# Patient Record
Sex: Female | Born: 1975 | Race: White | Hispanic: No | Marital: Married | Smoking: Former smoker
Health system: Southern US, Community
[De-identification: ages and names within clinical notes are randomized; demographics above are authoritative.]

## PROBLEM LIST (undated history)

## (undated) DIAGNOSIS — M329 Systemic lupus erythematosus, unspecified: Secondary | ICD-10-CM

## (undated) DIAGNOSIS — IMO0002 Reserved for concepts with insufficient information to code with codable children: Secondary | ICD-10-CM

## (undated) HISTORY — PX: APPENDECTOMY: SHX54

## (undated) HISTORY — PX: CHOLECYSTECTOMY: SHX55

## (undated) HISTORY — PX: OTHER SURGICAL HISTORY: SHX169

---

## 2016-01-24 ENCOUNTER — Emergency Department: Payer: BLUE CROSS/BLUE SHIELD

## 2016-01-24 ENCOUNTER — Encounter: Payer: Self-pay | Admitting: Emergency Medicine

## 2016-01-24 ENCOUNTER — Emergency Department
Admission: EM | Admit: 2016-01-24 | Discharge: 2016-01-24 | Disposition: A | Discharge status: Home / Self Care | Payer: BLUE CROSS/BLUE SHIELD | Attending: Student in an Organized Health Care Education/Training Program | Admitting: Student in an Organized Health Care Education/Training Program

## 2016-01-24 DIAGNOSIS — S29019A Strain of muscle and tendon of unspecified wall of thorax, initial encounter: Secondary | ICD-10-CM

## 2016-01-24 DIAGNOSIS — M549 Dorsalgia, unspecified: Secondary | ICD-10-CM | POA: Diagnosis present

## 2016-01-24 DIAGNOSIS — Y92828 Other wilderness area as the place of occurrence of the external cause: Secondary | ICD-10-CM | POA: Diagnosis not present

## 2016-01-24 DIAGNOSIS — Z87891 Personal history of nicotine dependence: Secondary | ICD-10-CM | POA: Diagnosis not present

## 2016-01-24 DIAGNOSIS — Y9311 Activity, swimming: Secondary | ICD-10-CM | POA: Insufficient documentation

## 2016-01-24 DIAGNOSIS — M62830 Muscle spasm of back: Secondary | ICD-10-CM

## 2016-01-24 DIAGNOSIS — Y999 Unspecified external cause status: Secondary | ICD-10-CM | POA: Insufficient documentation

## 2016-01-24 DIAGNOSIS — S29012A Strain of muscle and tendon of back wall of thorax, initial encounter: Secondary | ICD-10-CM | POA: Insufficient documentation

## 2016-01-24 DIAGNOSIS — X501XXA Overexertion from prolonged static or awkward postures, initial encounter: Secondary | ICD-10-CM | POA: Insufficient documentation

## 2016-01-24 MED ORDER — LIDOCAINE HCL (PF) 1 % IJ SOLN
INTRAMUSCULAR | Status: DC
Start: 2016-01-24 — End: 2016-01-25
  Filled 2016-01-24: qty 5

## 2016-01-24 MED ORDER — KETOROLAC TROMETHAMINE 30 MG/ML IJ SOLN
30.0000 mg | Freq: Once | INTRAMUSCULAR | Status: AC
  Administered 2016-01-24: 30 mg via INTRAMUSCULAR
  Filled 2016-01-24: qty 1

## 2016-01-24 MED ORDER — NAPROXEN 500 MG PO TABS: 500.0000 mg | ORAL_TABLET | Freq: Two times a day (BID) | ORAL | 0 refills | Status: AC

## 2016-01-24 MED ORDER — CYCLOBENZAPRINE HCL 10 MG PO TABS: 10.0000 mg | ORAL_TABLET | Freq: Three times a day (TID) | ORAL | 0 refills | Status: DC | PRN

## 2016-01-24 MED ORDER — DIAZEPAM 2 MG PO TABS
2.0000 mg | ORAL_TABLET | Freq: Once | ORAL | Status: AC
Start: 2016-01-24 — End: 2016-01-24
  Administered 2016-01-24: 2 mg via ORAL
  Filled 2016-01-24: qty 1

## 2016-01-24 NOTE — Discharge Instructions (Signed)
Apply warm heat to the affected area x 20 minutes 3-4 times daily.

## 2016-01-24 NOTE — ED Triage Notes (Signed)
Pt ambulatory to triage, no distress noted. Pt c/o right sided back pain and rib pain x3 weeks post fall. Pt states she had her feet taken out from under her, fell forward and has felt pain in the thoracic region of the back and feels worse when taking a deep breath. Pt unable to relieve pain and has not seen MD since injury.

## 2016-01-24 NOTE — ED Provider Notes (Signed)
[facility] ([place]) Emergency Department Provider Note  ____________________________________________  Time seen: Approximately 8:49 PM  I have reviewed the triage vital signs and the nursing notes.   HISTORY  Chief Complaint Back Pain    HPI Madeline Kelley is a 40 y.o. female , NAD, presents to the emergency department with 3 week history of right-sided back pain. [place], grabbed her ankles and pulled them out from underneath her. Patient states she was bent in a U shape with her abdomen and the water trying to stay afloat. States she quickly twisted to try to become upright to get her son to release her ankles and felt a sharp pain in her middle back. Notes that the pain has persisted over the last 3 weeks despite utilizing over-the-counter medications and muscle rubs. States she contacted a physician through a telemedicine app through her insurance to suggested that she be seen for physical evaluation. Patient denies any numbness, weakness, tingling. States she has been unable to lie on the right side due to the pain which has caused a decrease in quality sleep. Has not had any chest pain, shortness breath, neck pain. Has not noted any rashes or skin sores. Has not had any saddle paresthesias, loss of bowel or bladder control.   History reviewed. No pertinent past medical history.  There are no active problems to display for this patient.   Past Surgical History:  Procedure Laterality Date  . CHOLECYSTECTOMY      Prior to Admission medications   Medication Sig Start Date End Date Taking? Authorizing Provider  cyclobenzaprine (FLEXERIL) 10 MG tablet Take 1 tablet (10 mg total) by mouth 3 (three) times daily as needed for muscle spasms. 01/24/16   Brennon Otterness L Keller Bounds, PA-C  naproxen (NAPROSYN) 500 MG tablet Take 1 tablet (500 mg total) by mouth 2 (two) times daily with a meal. 01/24/16   Akiva Josey L Shaneta Cervenka, PA-C     Allergies Review of patient's allergies indicates no known allergies.  History reviewed. No pertinent family history.  Social History Social History  Substance Use Topics  . Smoking status: Former Games developer  . Smokeless tobacco: Never Used  . Alcohol use Yes     Review of Systems  Constitutional: No fever/chills, fatigue Cardiovascular: No chest pain. Respiratory: No shortness of breath. No wheezing.  Gastrointestinal: No abdominal pain.  No nausea, vomiting.  Musculoskeletal: Positive for back pain.  Skin: Negative for rash, redness, swelling, skin sores. Neurological: Negative for headaches, focal weakness or numbness.No saddle paresthesias, loss of bowel or bladder control, tingling 10-point ROS otherwise negative.  ____________________________________________   PHYSICAL EXAM:  VITAL SIGNS: ED Triage Vitals [01/24/16 2000]  Enc Vitals Group     BP (!) 159/90     Pulse Rate (!) 101     Resp 16     Temp 98 F (36.7 C)     Temp Source Oral     SpO2 98 %     Weight 267 lb (121.1 kg)     Height  (1.626 m)     Head Circumference      Peak Flow      Pain Score      Pain Loc      Pain Edu?      Excl. in GC?      Constitutional: Alert and oriented. Well appearing and in no acute distress. Eyes: Conjunctivae are normal without injection or icterus Head: Atraumatic.  Neck: Supple with full range of motion. Hematological/Lymphatic/Immunilogical: No cervical lymphadenopathy. Cardiovascular: Normal rate, regular rhythm. Normal S1 and S2.  Good peripheral circulation. Respiratory: Normal respiratory effort without tachypnea or retractions. Lungs CTAB with breath sounds noted in all lung fields Musculoskeletal: No tenderness to palpation about the midline thoracic, lumbar, sacral spinal area. Tenderness to palpation about the far lateral right lower thoracic region with muscle spasm appreciated. No pain to palpation about the left side of the thoracic  spine. Neurologic:  Normal speech and language. No gross focal neurologic deficits are appreciated.  Skin:  Skin is warm, dry and intact. No rash, redness, bruising, skin sores noted. Psychiatric: Mood and affect are normal. Speech and behavior are normal. Patient exhibits appropriate insight and judgement.   ____________________________________________   LABS  None ____________________________________________  EKG  None ____________________________________________  RADIOLOGY I have personally viewed and evaluated these images (plain radiographs) as part of my medical decision making, as well as reviewing the written report by the radiologist.  Dg Ribs Unilateral W/chest Right  Result Date: 01/24/2016 CLINICAL DATA:  Pt c/o right sided back pain and rib pain x3 weeks post fall. Pt states she had her feet taken out from under her, fell forward and has felt pain in the thoracic region of the back and feels worse when taking a deep breath. EXAM: RIGHT RIBS AND CHEST - 3+ VIEW COMPARISON:  None. FINDINGS: Heart size is normal. Lungs are clear. There are no focal consolidations or pleural effusions. No pneumothorax. No acute displaced fractures. Mild degenerative changes in the thoracic spine. IMPRESSION: No evidence for acute  abnormality. Electronically Signed   By: Norva Pavlov M.D.   On: 01/24/2016 20:41   Dg Thoracic Spine 2 View  Result Date: 01/24/2016 CLINICAL DATA:  Pt c/o right sided back pain and rib pain x3 weeks post fall. Pt states she had her feet taken out from under her, fell forward and has felt pain in the thoracic region of the back and feels worse when taking a deep breath. EXAM: THORACIC SPINE 2 VIEWS COMPARISON:  01/24/2016 FINDINGS: There is mild degenerative change in the mid thoracic spine. No acute fracture or subluxation identified. IMPRESSION: No evidence for acute  abnormality. Electronically Signed   By: Norva Pavlov M.D.   On: 01/24/2016 20:39     ____________________________________________    PROCEDURES  Procedure(s) performed: None   Procedures   Medications  lidocaine (PF) (XYLOCAINE) 1 % injection (not administered)  ketorolac (TORADOL) 30 MG/ML injection 30 mg (30 mg Intramuscular Given 01/24/16 2111)  diazepam (VALIUM) tablet 2 mg (2 mg Oral Given 01/24/16 2111)     ____________________________________________   INITIAL IMPRESSION / ASSESSMENT AND PLAN / ED COURSE  Pertinent labs & imaging results that were available during my care of the patient were reviewed by me and considered in my medical decision making (see chart for details).  Clinical Course    Patient's diagnosis is consistent with Thoracic myofascial strain and muscle spasm of back. Patient will be discharged home with prescriptions for naproxen and Flexeril to take as directed. Patient advised of significant somnolence they can come with taking Flexeril and advised not to drink alcohol or drive while on the medication. Patient is to follow up with [facility] if symptoms persist past this treatment course. Patient is given ED precautions to return to the ED for any worsening or new symptoms.    ____________________________________________  FINAL CLINICAL IMPRESSION(S) / ED DIAGNOSES  Final diagnoses:  Thoracic myofascial strain, initial encounter  Muscle spasm of back      NEW MEDICATIONS STARTED DURING THIS VISIT:  New Prescriptions   CYCLOBENZAPRINE (FLEXERIL) 10 MG TABLET    Take 1 tablet (10 mg total) by mouth 3 (three) times daily as needed for muscle spasms.   NAPROXEN (NAPROSYN) 500 MG TABLET    Take 1 tablet (500 mg total) by mouth 2 (two) times daily with a meal.         Hope PigeonJami L Tiffaney Heimann, PA-C 01/24/16 2133    Willy EddyPatrick Robinson, MD 01/25/16 [phone]

## 2016-02-20 ENCOUNTER — Encounter: Payer: Self-pay | Admitting: *Deleted

## 2016-02-20 ENCOUNTER — Ambulatory Visit
Admission: EM | Admit: 2016-02-20 | Discharge: 2016-02-20 | Disposition: A | Discharge status: Home / Self Care | Payer: BLUE CROSS/BLUE SHIELD | Attending: Family Medicine | Admitting: Family Medicine

## 2016-02-20 DIAGNOSIS — M546 Pain in thoracic spine: Secondary | ICD-10-CM | POA: Diagnosis not present

## 2016-02-20 HISTORY — DX: Systemic lupus erythematosus, unspecified: M32.9

## 2016-02-20 HISTORY — DX: Reserved for concepts with insufficient information to code with codable children: IMO0002

## 2016-02-20 MED ORDER — HYDROCODONE-ACETAMINOPHEN 5-325 MG PO TABS: ORAL_TABLET | ORAL | 0 refills | Status: DC

## 2016-02-20 MED ORDER — PREDNISONE 20 MG PO TABS: 20.0000 mg | ORAL_TABLET | Freq: Every day | ORAL | 0 refills | Status: AC

## 2016-02-20 NOTE — ED Triage Notes (Signed)
Back injury while swimming in June. Seen at [facility] ED approx 1 month after initial injury. Pain is worse now. Pain to right of spine and radiates to right lateral rib region. States has difficulty breathing at times with pain.

## 2016-03-13 ENCOUNTER — Emergency Department: Payer: BLUE CROSS/BLUE SHIELD

## 2016-03-13 ENCOUNTER — Encounter: Payer: Self-pay | Admitting: Emergency Medicine

## 2016-03-13 ENCOUNTER — Observation Stay
Admission: EM | Admit: 2016-03-13 | Discharge: 2016-03-13 | Disposition: A | Discharge status: Home / Self Care | Payer: BLUE CROSS/BLUE SHIELD | Attending: Family Medicine | Admitting: Family Medicine

## 2016-03-13 DIAGNOSIS — R918 Other nonspecific abnormal finding of lung field: Secondary | ICD-10-CM | POA: Diagnosis not present

## 2016-03-13 DIAGNOSIS — R000 Tachycardia, unspecified: Secondary | ICD-10-CM | POA: Insufficient documentation

## 2016-03-13 DIAGNOSIS — R200 Anesthesia of skin: Secondary | ICD-10-CM | POA: Diagnosis not present

## 2016-03-13 DIAGNOSIS — R1011 Right upper quadrant pain: Principal | ICD-10-CM | POA: Insufficient documentation

## 2016-03-13 DIAGNOSIS — R109 Unspecified abdominal pain: Secondary | ICD-10-CM | POA: Diagnosis present

## 2016-03-13 DIAGNOSIS — R079 Chest pain, unspecified: Secondary | ICD-10-CM | POA: Diagnosis not present

## 2016-03-13 DIAGNOSIS — M329 Systemic lupus erythematosus, unspecified: Secondary | ICD-10-CM | POA: Diagnosis not present

## 2016-03-13 DIAGNOSIS — Z87891 Personal history of nicotine dependence: Secondary | ICD-10-CM | POA: Insufficient documentation

## 2016-03-13 DIAGNOSIS — M546 Pain in thoracic spine: Secondary | ICD-10-CM | POA: Insufficient documentation

## 2016-03-13 LAB — COMPREHENSIVE METABOLIC PANEL
ALT: 20 U/L (ref 14–54)
AST: 26 U/L (ref 15–41)
Albumin: 3.8 g/dL (ref 3.5–5.0)
Alkaline Phosphatase: 64 U/L (ref 38–126)
Anion gap: 12 (ref 5–15)
BUN: 17 mg/dL (ref 6–20)
CHLORIDE: 105 mmol/L (ref 101–111)
CO2: 20 mmol/L — ABNORMAL LOW (ref 22–32)
Calcium: 9 mg/dL (ref 8.9–10.3)
Creatinine, Ser: 0.91 mg/dL (ref 0.44–1.00)
Glucose, Bld: 135 mg/dL — ABNORMAL HIGH (ref 65–99)
POTASSIUM: 3.7 mmol/L (ref 3.5–5.1)
Sodium: 137 mmol/L (ref 135–145)
Total Bilirubin: 0.7 mg/dL (ref 0.3–1.2)
Total Protein: 7.7 g/dL (ref 6.5–8.1)

## 2016-03-13 LAB — CBC
HCT: 38.7 % (ref 35.0–47.0)
HEMOGLOBIN: 13.3 g/dL (ref 12.0–16.0)
MCH: 26.7 pg (ref 26.0–34.0)
MCHC: 34.4 g/dL (ref 32.0–36.0)
MCV: 77.4 fL — AB (ref 80.0–100.0)
Platelets: 231 10*3/uL (ref 150–440)
RBC: 4.99 MIL/uL (ref 3.80–5.20)
RDW: 14.6 % — AB (ref 11.5–14.5)
WBC: 7.9 10*3/uL (ref 3.6–11.0)

## 2016-03-13 LAB — URINALYSIS COMPLETE WITH MICROSCOPIC (ARMC ONLY)
Bilirubin Urine: NEGATIVE
Glucose, UA: NEGATIVE mg/dL
Ketones, ur: NEGATIVE mg/dL
LEUKOCYTES UA: NEGATIVE
NITRITE: NEGATIVE
PROTEIN: NEGATIVE mg/dL
SPECIFIC GRAVITY, URINE: 1.019 (ref 1.005–1.030)
pH: 5 (ref 5.0–8.0)

## 2016-03-13 LAB — LIPASE, BLOOD: LIPASE: 22 U/L (ref 11–51)

## 2016-03-13 LAB — PREGNANCY, URINE: PREG TEST UR: NEGATIVE

## 2016-03-13 MED ORDER — IOPAMIDOL (ISOVUE-300) INJECTION 61%
100.0000 mL | Freq: Once | INTRAVENOUS | Status: DC | PRN
  Filled 2016-03-13: qty 100

## 2016-03-13 MED ORDER — HYDROCODONE-ACETAMINOPHEN 5-325 MG PO TABS: 1.0000 | ORAL_TABLET | ORAL | Status: DC | PRN

## 2016-03-13 MED ORDER — FAMOTIDINE 20 MG PO TABS: 20.0000 mg | ORAL_TABLET | Freq: Every day | ORAL | Status: DC

## 2016-03-13 MED ORDER — MELATONIN 10 MG PO TABS: 10.0000 mg | ORAL_TABLET | Freq: Every day | ORAL | Status: DC

## 2016-03-13 MED ORDER — DIAZEPAM 5 MG PO TABS
5.0000 mg | ORAL_TABLET | Freq: Once | ORAL | Status: AC
  Administered 2016-03-13: 5 mg via ORAL

## 2016-03-13 MED ORDER — ACETAMINOPHEN 325 MG PO TABS: 650.0000 mg | ORAL_TABLET | Freq: Four times a day (QID) | ORAL | Status: DC | PRN

## 2016-03-13 MED ORDER — SODIUM CHLORIDE 0.9% FLUSH: 3.0000 mL | Freq: Two times a day (BID) | INTRAVENOUS | Status: DC

## 2016-03-13 MED ORDER — ONDANSETRON HCL 4 MG PO TABS: 4.0000 mg | ORAL_TABLET | Freq: Four times a day (QID) | ORAL | Status: DC | PRN

## 2016-03-13 MED ORDER — ACETAMINOPHEN 650 MG RE SUPP: 650.0000 mg | Freq: Four times a day (QID) | RECTAL | Status: DC | PRN

## 2016-03-13 MED ORDER — HYDROCODONE-ACETAMINOPHEN 5-325 MG PO TABS: 1.0000 | ORAL_TABLET | ORAL | 0 refills | Status: DC | PRN

## 2016-03-13 MED ORDER — MORPHINE SULFATE (PF) 2 MG/ML IV SOLN: 1.0000 mg | INTRAVENOUS | Status: DC | PRN

## 2016-03-13 MED ORDER — BISACODYL 5 MG PO TBEC: 5.0000 mg | DELAYED_RELEASE_TABLET | Freq: Every day | ORAL | Status: DC | PRN

## 2016-03-13 MED ORDER — HYDROMORPHONE HCL 1 MG/ML IJ SOLN
1.0000 mg | Freq: Once | INTRAMUSCULAR | Status: AC
  Administered 2016-03-13: 1 mg via INTRAVENOUS
  Filled 2016-03-13: qty 1

## 2016-03-13 MED ORDER — MAGNESIUM CITRATE PO SOLN: 1.0000 | Freq: Once | ORAL | Status: DC | PRN

## 2016-03-13 MED ORDER — ONDANSETRON HCL 4 MG/2ML IJ SOLN: 4.0000 mg | Freq: Four times a day (QID) | INTRAMUSCULAR | Status: DC | PRN

## 2016-03-13 MED ORDER — DIAZEPAM 5 MG PO TABS
ORAL_TABLET | ORAL | Status: AC
  Administered 2016-03-13: 5 mg via ORAL
  Filled 2016-03-13: qty 1

## 2016-03-13 MED ORDER — CYCLOBENZAPRINE HCL 5 MG PO TABS: 5.0000 mg | ORAL_TABLET | Freq: Three times a day (TID) | ORAL | 0 refills | Status: AC | PRN

## 2016-03-13 MED ORDER — SODIUM CHLORIDE 0.9 % IV SOLN: INTRAVENOUS | Status: DC

## 2016-03-13 MED ORDER — LORATADINE 10 MG PO TABS: 10.0000 mg | ORAL_TABLET | ORAL | Status: DC

## 2016-03-13 MED ORDER — SENNOSIDES-DOCUSATE SODIUM 8.6-50 MG PO TABS: 1.0000 | ORAL_TABLET | Freq: Every evening | ORAL | Status: DC | PRN

## 2016-03-13 MED ORDER — IOPAMIDOL (ISOVUE-370) INJECTION 76%
100.0000 mL | Freq: Once | INTRAVENOUS | Status: AC | PRN
  Administered 2016-03-13: 100 mL via INTRAVENOUS
  Filled 2016-03-13: qty 100

## 2016-03-13 MED ORDER — IOPAMIDOL (ISOVUE-300) INJECTION 61%
30.0000 mL | Freq: Once | INTRAVENOUS | Status: AC | PRN
  Administered 2016-03-13: 30 mL via ORAL
  Filled 2016-03-13: qty 30

## 2016-03-13 MED ORDER — ONDANSETRON HCL 4 MG/2ML IJ SOLN
4.0000 mg | Freq: Once | INTRAMUSCULAR | Status: AC
  Administered 2016-03-13: 4 mg via INTRAVENOUS
  Filled 2016-03-13: qty 2

## 2016-03-13 MED ORDER — SODIUM CHLORIDE 0.9 % IV BOLUS (SEPSIS)
1000.0000 mL | Freq: Once | INTRAVENOUS | Status: AC
  Administered 2016-03-13: 1000 mL via INTRAVENOUS

## 2016-03-13 NOTE — ED Notes (Signed)
Pt taken off of observation admission order. [facility] be [facility]

## 2016-03-13 NOTE — ED Triage Notes (Addendum)
Pt c/o RUQ pain for 2 months. Reports has been seen twice for same but was not referred for FU. Has had yellow diarrhea. No vomiting. Reports has not had imaging.

## 2016-03-13 NOTE — ED Provider Notes (Signed)
[facility] Emergency Department Provider Note  ____________________________________________  Time seen: Approximately 6:17 PM  I have reviewed the triage vital signs and the nursing notes.   HISTORY  Chief Complaint Abdominal Pain    HPI Madeline Kelley is a 40 y.o. female s/p remote appy, cholecystectomy and c/s x 2 presenting with persistent right upper quadrant and right lateral chest wall pain since June. The patient [place] her feet and pulled her upwards, resulting in a "burning" RUQ and R side pain.  She was seen in the emergency department and given Flexeril for presumed musculoskeletal strain. This pain has come and gone since June and she has had multiple outside emergency department visits for the same. Most recently, she was given prednisone and ibuprofen, which seemed to help the pain. At this time, the patient reports that her pain is most severe when she lays on that side, with positional changes, or with deep breaths. She has chronic diarrhea which now is yellow. She has not had any nausea or vomiting, no fever, no numbness or tingling, no saddle anesthesia, no difficulty walking.Additionally, the patient has tried a heating pad, BenGay like cream, massage, ibuprofen, without any improvement.   Past Medical History:  Diagnosis Date  . Lupus (HCC)     There are no active problems to display for this patient.   Past Surgical History:  Procedure Laterality Date  . APPENDECTOMY    . CESAREAN SECTION    . CHOLECYSTECTOMY    . uterine ablation      Current Outpatient Rx  . Order #: [id number] Class: Print  . Order #: [id number] Class: Print  . Order #: [id number] Class: Print  . Order #: [id number] Class: Print    Allergies Review of patient's allergies indicates no known allergies.  History reviewed. No pertinent family history.  Social History Social History  Substance Use Topics  . Smoking  status: Former Games developer  . Smokeless tobacco: Never Used  . Alcohol use Yes    Review of Systems Constitutional: No fever/chills.No lightheadedness or syncope. Eyes: No visual changes. ENT: No sore throat. No congestion or rhinorrhea. Cardiovascular: Positive right lateral chest wall pain.  Denies palpitations. Respiratory: Denies shortness of breath, but pain is worse with deep breaths.  No cough. Gastrointestinal: Positive right upper quadrant abdominal pain.  No nausea, no vomiting.  Positive diarrhea.  No constipation. Genitourinary: Negative for dysuria. Musculoskeletal: Negative for midline back pain. Skin: Negative for rash. Neurological: Negative for headaches. No focal numbness, tingling or weakness.   10-point ROS otherwise negative.  ____________________________________________   PHYSICAL EXAM:  VITAL SIGNS: ED Triage Vitals [03/13/16 1732]  Enc Vitals Group     BP (!) 162/92     Pulse Rate (!) 115     Resp (!) 22     Temp 98.7 F (37.1 C)     Temp Source Oral     SpO2 97 %     Weight 260 lb (117.9 kg)     Height 5\' 4"  (1.626 m)     Head Circumference      Peak Flow      Pain Score 7     Pain Loc      Pain Edu?      Excl. in GC?     Constitutional: Alert and oriented. Anxious appearing but in no acute distress. Answers questions appropriately. Eyes: Conjunctivae are normal.  EOMI. No scleral icterus. Head: Atraumatic. Nose: No  congestion/rhinnorhea. Mouth/Throat: Mucous membranes are moist.  Neck: No stridor.  Supple.   Cardiovascular: Normal rate, regular rhythm. No murmurs, rubs or gallops.  Respiratory: Normal respiratory effort.  No accessory muscle use or retractions. Lungs CTAB.  No wheezes, rales or ronchi. Gastrointestinal: Obese. Soft and nondistended.  Tender to palpation in the right upper quadrant and along the right lateral chest wall without any obvious skin changes, crepitus, or rib instability. No guarding or rebound.  No peritoneal  signs. Musculoskeletal: No LE edema. No ttp in the calves or palpable cords.  Negative Homan's sign. Neurologic:  A&Ox3.  Speech is clear.  Face and smile are symmetric.  EOMI.  Moves all extremities well. Skin:  Skin is warm, dry and intact. No rash noted. Psychiatric: Mood and affect are normal. Speech and behavior are normal.  Normal judgement.  ____________________________________________   LABS (all labs ordered are listed, but only abnormal results are displayed)  Labs Reviewed  COMPREHENSIVE METABOLIC PANEL - Abnormal; Notable for the following:       Result Value   CO2 20 (*)    Glucose, Bld 135 (*)    All other components within normal limits  CBC - Abnormal; Notable for the following:    MCV 77.4 (*)    RDW 14.6 (*)    All other components within normal limits  URINALYSIS COMPLETEWITH MICROSCOPIC ([facility] ONLY) - Abnormal; Notable for the following:    Color, Urine YELLOW (*)    APPearance HAZY (*)    Hgb urine dipstick 3+ (*)    Bacteria, UA RARE (*)    Squamous Epithelial / LPF 6-30 (*)    All other components within normal limits  LIPASE, BLOOD  PREGNANCY, URINE  PORPHOBILINOGEN, RANDOM URINE   ____________________________________________  EKG  ED ECG REPORT I, Rockne MenghiniNorman, Anne-Caroline, the attending physician, personally viewed and interpreted this ECG.   Date: 03/13/2016  EKG Time: 1737  Rate: 108  Rhythm: sinus tachycardia  Axis: Normal  Intervals:none  ST&T Change: Nonspecific T-wave inversions. No ST elevation.  ____________________________________________  RADIOLOGY  Ct Angio Chest Pe W And/or Wo Contrast  Result Date: 03/13/2016 CLINICAL DATA:  Pt c/o RUQ pain and SOB for 2 months. Reports has been seen twice for same but was not referred for FU. Has had yellow diarrhea. No vomiting EXAM: CT ANGIOGRAPHY CHEST CT ABDOMEN AND PELVIS WITH CONTRAST TECHNIQUE: Multidetector CT imaging of the chest was performed using the standard protocol during bolus  administration of intravenous contrast. Multiplanar CT image reconstructions and MIPs were obtained to evaluate the vascular anatomy. Multidetector CT imaging of the abdomen and pelvis was performed using the standard protocol during bolus administration of intravenous contrast. CONTRAST:  100 mL of Isovue 370 intravenous contrast COMPARISON:  None. FINDINGS: CTA CHEST FINDINGS Cardiovascular: No evidence of a pulmonary embolus. The aorta is normal in caliber. No dissection. No atherosclerotic plaque. Aortic branch vessels are widely patent. Heart is normal in size and configuration. There are minor left coronary artery calcifications. Mediastinum/Nodes: No enlarged mediastinal, hilar, or axillary lymph nodes. Thyroid gland, trachea, and esophagus demonstrate no significant findings. Lungs/Pleura: There are several small nodules, less than 3 mm, 1 in the posterior right upper lobe, another in the posterior left upper lobe and a third in the posterior left lower lobe. Lungs are otherwise clear. No pleural effusion. No pneumothorax. Musculoskeletal: No chest wall abnormality. No acute or significant osseous findings. Review of the MIP images confirms the above findings. CT ABDOMEN and PELVIS FINDINGS Hepatobiliary:  No focal liver abnormality is seen. Status post cholecystectomy. No biliary dilatation. Pancreas: Unremarkable. No pancreatic ductal dilatation or surrounding inflammatory changes. Spleen: Normal in size without focal abnormality. Adrenals/Urinary Tract: Adrenal glands are unremarkable. Kidneys are normal, without renal calculi, focal lesion, or hydronephrosis. Bladder is unremarkable. Stomach/Bowel: Stomach and small bowel are unremarkable. Colon is unremarkable with no wall thickening or inflammatory change. The appendix is surgically absent. Vascular/Lymphatic: Minimal atherosclerotic calcifications along the abdominal aorta. No aneurysm. No enlarged lymph nodes. Reproductive: Small sub serosal fibroid  from the anterior uterine fundus measuring 2 cm. Uterus and adnexa are otherwise unremarkable. Other: No abdominal wall hernia or abnormality. No abdominopelvic ascites. Musculoskeletal: No acute or significant osseous findings. Review of the MIP images confirms the above findings. IMPRESSION: 1. No acute findings within the abdomen pelvis. 2. Status post cholecystectomy and appendectomy. 3. Small sub serosal uterine fibroid. 4. Minor aortic atherosclerosis. Electronically Signed   By: Amie Portland M.D.   On: 03/13/2016 20:00   Ct Abdomen Pelvis W Contrast  Result Date: 03/13/2016 CLINICAL DATA:  Pt c/o RUQ pain and SOB for 2 months. Reports has been seen twice for same but was not referred for FU. Has had yellow diarrhea. No vomiting EXAM: CT ANGIOGRAPHY CHEST CT ABDOMEN AND PELVIS WITH CONTRAST TECHNIQUE: Multidetector CT imaging of the chest was performed using the standard protocol during bolus administration of intravenous contrast. Multiplanar CT image reconstructions and MIPs were obtained to evaluate the vascular anatomy. Multidetector CT imaging of the abdomen and pelvis was performed using the standard protocol during bolus administration of intravenous contrast. CONTRAST:  100 mL of Isovue 370 intravenous contrast COMPARISON:  None. FINDINGS: CTA CHEST FINDINGS Cardiovascular: No evidence of a pulmonary embolus. The aorta is normal in caliber. No dissection. No atherosclerotic plaque. Aortic branch vessels are widely patent. Heart is normal in size and configuration. There are minor left coronary artery calcifications. Mediastinum/Nodes: No enlarged mediastinal, hilar, or axillary lymph nodes. Thyroid gland, trachea, and esophagus demonstrate no significant findings. Lungs/Pleura: There are several small nodules, less than 3 mm, 1 in the posterior right upper lobe, another in the posterior left upper lobe and a third in the posterior left lower lobe. Lungs are otherwise clear. No pleural effusion. No  pneumothorax. Musculoskeletal: No chest wall abnormality. No acute or significant osseous findings. Review of the MIP images confirms the above findings. CT ABDOMEN and PELVIS FINDINGS Hepatobiliary: No focal liver abnormality is seen. Status post cholecystectomy. No biliary dilatation. Pancreas: Unremarkable. No pancreatic ductal dilatation or surrounding inflammatory changes. Spleen: Normal in size without focal abnormality. Adrenals/Urinary Tract: Adrenal glands are unremarkable. Kidneys are normal, without renal calculi, focal lesion, or hydronephrosis. Bladder is unremarkable. Stomach/Bowel: Stomach and small bowel are unremarkable. Colon is unremarkable with no wall thickening or inflammatory change. The appendix is surgically absent. Vascular/Lymphatic: Minimal atherosclerotic calcifications along the abdominal aorta. No aneurysm. No enlarged lymph nodes. Reproductive: Small sub serosal fibroid from the anterior uterine fundus measuring 2 cm. Uterus and adnexa are otherwise unremarkable. Other: No abdominal wall hernia or abnormality. No abdominopelvic ascites. Musculoskeletal: No acute or significant osseous findings. Review of the MIP images confirms the above findings. IMPRESSION: 1. No acute findings within the abdomen pelvis. 2. Status post cholecystectomy and appendectomy. 3. Small sub serosal uterine fibroid. 4. Minor aortic atherosclerosis. Electronically Signed   By: Amie Portland M.D.   On: 03/13/2016 20:00    ____________________________________________   PROCEDURES  Procedure(s) performed: None  Procedures  Critical Care performed: No  ____________________________________________   INITIAL IMPRESSION / ASSESSMENT AND PLAN / ED COURSE  Pertinent labs & imaging results that were available during my care of the patient were reviewed by me and considered in my medical decision making (see chart for details).  40 y.o. female with a history of multiple abdominal surgeries presenting  with right upper quadrant and right lateral chest wall pain that has been intermittent for 3 months after traumatic episode. The patient does have sinus tachycardia here although she does exhibit some anxiety about her discomfort and illness. The etiology of the patient's symptoms is unclear, especially given the intermittent nature of the pain, lack of associated symptoms, and duration of illness. However, we will get imaging to evaluate for liver pathology including liver cyst or mass, intestinal-related abnormalities, and also a CT angiogram to rule out PE given that the pain is worse with deep breaths. If her workup in the emergency department is negative, the patient will need to follow-up for other possible etiologies including Central regional pain syndrome, acute intermittent porphyria.  ----------------------------------------- 6:32 PM on 03/13/2016 -----------------------------------------  I have talked to the laboratory, an order to order a spot urine PBG, and this is a send out lab. Await the results of the patient's studies.  I have talked to the patient again, and she has had intermittent episodes of patchy numbness and tingling, most prominent in the right upper extremity, right hand and right foot. Patient is a had pink urine this morning, and occasionally has tea colored urine.  ----------------------------------------- 8:46 PM on 03/13/2016 -----------------------------------------  The patient's workup in the emergency department has been reassuring. Her CT angiogram of the chest does not show PE or any other acute findings. The CT of the abdomen also does not show any acute findings to 6 been the patient's pain. Again I am concerned about acute intermittent porphyria, and have talked to the hospitalist about admission for this patient given that she may be in an acute flare and will require monitoring for progression. ____________________________________________  FINAL CLINICAL  IMPRESSION(S) / ED DIAGNOSES  Final diagnoses:  Pulmonary nodules  Right upper quadrant pain  Right-sided chest pain  Numbness  Sinus tachycardia (HCC)    Clinical Course      NEW MEDICATIONS STARTED DURING THIS VISIT:  New Prescriptions   No medications on file      Rockne Menghini, MD 03/13/16 2047

## 2016-03-13 NOTE — ED Provider Notes (Signed)
-----------------------------------------   10:47 PM on 03/13/2016 -----------------------------------------  Patient care assumed from Dr. Sharma CovertNorman. The hospitalist had seen the patient, however the patient now states she wants to go home and does not want to be admitted to the hospital. Patient has capacity to refuse admission, appears to understand the risks of leaving, she still wishes to leave. We will discharge a short course of pain medication and PCP follow-up in the next 1-2 days. The patient is agreeable to this plan. I discussed return precautions for any worsening abdominal pain, or fever.   Minna AntisKevin Temprance Wyre, MD 03/13/16 2248

## 2016-03-13 NOTE — Discharge Instructions (Signed)
Please follow-up with your primary care provider in the next 1-2 days for recheck/reevaluation. Return to the emergency department for any worsening pain, any trouble breathing, or fever.

## 2016-03-14 NOTE — Consult Note (Signed)
[facility] - [place] @ [facility] Admission History and Physical [employer], D.O.  ---------------------------------------------------------------------------------------------------------------------   PATIENT NAME: Madeline Kelley MR#: [id number] DATE OF BIRTH: 1976-04-12 DATE OF ADMISSION: 03/13/2016 PRIMARY CARE PHYSICIAN: No PCP Per Patient  REQUESTING/REFERRING PHYSICIAN: ED Dr. Sharma Covert  CHIEF COMPLAINT: Chief Complaint  Patient presents with  . Abdominal Pain    HISTORY OF PRESENT ILLNESS: Madeline Kelley is a 40 y.o. female with a known history of Lupus was in a usual state of health until June 2017. Patient states that [place] when she felt a severe sudden sharp right back and flank pain.  Patient reports intermittent exacerbations of similar pain over the past few months which have led to several hospital emergency department visits. Today she comes into the emergency department complaining of right upper quadrant and right mid back pain which is unrelieved by ibuprofen, muscle relaxants, heating pad and stretching. Pain is worse when she lays on her side or on her belly and also worse when she changes positions going from laying to sitting. During previous emergency department visit she has been advised to pursue an MRI for possible herniated disks which she has not yet done. Patient was concerned today because she had some numbness and tingling in the right lower extremity radiating down from her low spine. She also reports some nausea, greenish diarrhea and dark urine which has been intermittent for the past 2 weeks.  Otherwise there has been no change in status. Patient has been taking medication as prescribed and there has been no recent change in medication or diet.  There has been no recent illness, travel or sick contacts.    Of note patient reports that when she was diagnosed with lupus she was placed on several courses of oral  steroids which caused a 30-40 pound weight gain and significant increase in breast size in the past 8 months. She notices significant upper back pain.  Patient denies fevers/chills, weakness, dizziness, chest pain, shortness of breath, dysuria/frequency, changes in mental status.   EMS/ED COURSE:   Patient received dialogue and, Zofran and IV normal saline  PAST MEDICAL HISTORY: Past Medical History:  Diagnosis Date  . Lupus (HCC)       PAST SURGICAL HISTORY: Past Surgical History:  Procedure Laterality Date  . APPENDECTOMY    . CESAREAN SECTION    . CHOLECYSTECTOMY    . uterine ablation        SOCIAL HISTORY: Social History  Substance Use Topics  . Smoking status: Former Games developer  . Smokeless tobacco: Never Used  . Alcohol use Yes      FAMILY HISTORY: History reviewed. No pertinent family history.   MEDICATIONS AT HOME: Prior to Admission medications   Medication Sig Start Date End Date Taking? Authorizing Provider  loratadine (CLARITIN) 10 MG tablet Take 10 mg by mouth every morning.   Yes Historical Provider, MD  Melatonin 10 MG TABS Take 10 mg by mouth at bedtime.   Yes Historical Provider, MD  ranitidine (ZANTAC) 150 MG tablet Take 150 mg by mouth daily as needed for heartburn.   Yes Historical Provider, MD  cyclobenzaprine (FLEXERIL) 5 MG tablet Take 1 tablet (5 mg total) by mouth 3 (three) times daily as needed for muscle spasms. 03/13/16   Minna Antis, MD  naproxen (NAPROSYN) 500 MG tablet Take 1 tablet (500 mg total) by mouth 2 (two) times daily with a meal. Patient not taking: Reported on 03/13/2016 01/24/16  Jami L Hagler, PA-C  predniSONE (DELTASONE) 20 MG tablet Take 1 tablet (20 mg total) by mouth daily. Patient not taking: Reported on 03/13/2016 02/20/16   Payton Mccallum, MD      DRUG ALLERGIES: No Known Allergies   REVIEW OF SYSTEMS: CONSTITUTIONAL: No fever/chills, fatigue, weakness, weight gain/loss, headache EYES: No blurry or double  vision. ENT: No tinnitus, postnasal drip, redness or soreness of the oropharynx. RESPIRATORY: No cough, wheeze, hemoptysis, dyspnea. CARDIOVASCULAR: No chest pain, orthopnea, palpitations, syncope. GASTROINTESTINAL: Positive nausea,  diarrhea, abdominal pain, negative constipation, vomiting, hematemesis, melena or hematochezia. GENITOURINARY: No dysuria or hematuria. Positive dark urine ENDOCRINE: No polyuria or nocturia. No heat or cold intolerance. HEMATOLOGY: No anemia, bruising, bleeding. INTEGUMENTARY: No rashes, ulcers, lesions. MUSCULOSKELETAL: No arthritis, swelling, gout. NEUROLOGIC: No numbness, tingling, weakness or ataxia. No seizure-type activity. PSYCHIATRIC: No anxiety, depression, insomnia.  PHYSICAL EXAMINATION: VITAL SIGNS: Blood pressure (!) 128/96, pulse 73, temperature 98.7 F (37.1 C), temperature source Oral, resp. rate 15, height 5\' 4"  (1.626 m), weight 117.9 kg (260 lb), SpO2 100 %.  GENERAL: 41 y.o.-year-old white female patient, well-developed, well-nourished lying in the bed in no acute distress.  Pleasant and cooperative.   HEENT: Head atraumatic, normocephalic. Pupils equal, round, reactive to light and accommodation. No scleral icterus. Extraocular muscles intact. Nares are patent. Oropharynx is clear. Mucus membranes moist. NECK: Supple, full range of motion. No JVD, no bruit heard. No thyroid enlargement, no tenderness, no cervical lymphadenopathy. CHEST: Normal breath sounds bilaterally. No wheezing, rales, rhonchi or crackles. No use of accessory muscles of respiration.  No reproducible chest wall tenderness.  CARDIOVASCULAR: S1, S2 normal. No murmurs, rubs, or gallops. Cap refill <2 seconds. ABDOMEN: Soft, generalized tenderness, worse at right upper quadrant nondistended. No rebound, guarding, rigidity. Normoactive bowel sounds present in all four quadrants. No organomegaly or mass. EXTREMITIES: Full range of motion. No pedal edema, cyanosis, or  clubbing. NEUROLOGIC: Cranial nerves II through XII are grossly intact with no focal sensorimotor deficit. Muscle strength 5/5 in all extremities. Sensation intact. Gait intact. Straight leg raise test is negative SPINE: Patient has severe paravertebral muscle spasm bilaterally right greater than left at the mid thoracic region. There is point tenderness over the right thoracic paraspinal musculature which radiates to the right upper quadrant when palpated. PSYCHIATRIC: The patient is alert and oriented x 3. Normal affect, mood, thought content. SKIN: Warm, dry, and intact without obvious rash, lesion, or ulcer.  LABORATORY PANEL:  CBC  Recent Labs Lab 03/13/16 1733  WBC 7.9  HGB 13.3  HCT 38.7  PLT 231   ----------------------------------------------------------------------------------------------------------------- Chemistries  Recent Labs Lab 03/13/16 1733  NA 137  K 3.7  CL 105  CO2 20*  GLUCOSE 135*  BUN 17  CREATININE 0.91  CALCIUM 9.0  AST 26  ALT 20  ALKPHOS 64  BILITOT 0.7   ------------------------------------------------------------------------------------------------------------------ Cardiac Enzymes No results for input(s): TROPONINI in the last 168 hours. ------------------------------------------------------------------------------------------------------------------  RADIOLOGY: Ct Angio Chest Pe W And/or Wo Contrast  Result Date: 03/13/2016 CLINICAL DATA:  Pt c/o RUQ pain and SOB for 2 months. Reports has been seen twice for same but was not referred for FU. Has had yellow diarrhea. No vomiting EXAM: CT ANGIOGRAPHY CHEST CT ABDOMEN AND PELVIS WITH CONTRAST TECHNIQUE: Multidetector CT imaging of the chest was performed using the standard protocol during bolus administration of intravenous contrast. Multiplanar CT image reconstructions and MIPs were obtained to evaluate the vascular anatomy. Multidetector CT imaging of the abdomen and pelvis was performed  using the standard protocol during bolus administration of intravenous contrast. CONTRAST:  100 mL of Isovue 370 intravenous contrast COMPARISON:  None. FINDINGS: CTA CHEST FINDINGS Cardiovascular: No evidence of a pulmonary embolus. The aorta is normal in caliber. No dissection. No atherosclerotic plaque. Aortic branch vessels are widely patent. Heart is normal in size and configuration. There are minor left coronary artery calcifications. Mediastinum/Nodes: No enlarged mediastinal, hilar, or axillary lymph nodes. Thyroid gland, trachea, and esophagus demonstrate no significant findings. Lungs/Pleura: There are several small nodules, less than 3 mm, 1 in the posterior right upper lobe, another in the posterior left upper lobe and a third in the posterior left lower lobe. Lungs are otherwise clear. No pleural effusion. No pneumothorax. Musculoskeletal: No chest wall abnormality. No acute or significant osseous findings. Review of the MIP images confirms the above findings. CT ABDOMEN and PELVIS FINDINGS Hepatobiliary: No focal liver abnormality is seen. Status post cholecystectomy. No biliary dilatation. Pancreas: Unremarkable. No pancreatic ductal dilatation or surrounding inflammatory changes. Spleen: Normal in size without focal abnormality. Adrenals/Urinary Tract: Adrenal glands are unremarkable. Kidneys are normal, without renal calculi, focal lesion, or hydronephrosis. Bladder is unremarkable. Stomach/Bowel: Stomach and small bowel are unremarkable. Colon is unremarkable with no wall thickening or inflammatory change. The appendix is surgically absent. Vascular/Lymphatic: Minimal atherosclerotic calcifications along the abdominal aorta. No aneurysm. No enlarged lymph nodes. Reproductive: Small sub serosal fibroid from the anterior uterine fundus measuring 2 cm. Uterus and adnexa are otherwise unremarkable. Other: No abdominal wall hernia or abnormality. No abdominopelvic ascites. Musculoskeletal: No acute or  significant osseous findings. Review of the MIP images confirms the above findings. IMPRESSION: 1. No acute findings within the abdomen pelvis. 2. Status post cholecystectomy and appendectomy. 3. Small sub serosal uterine fibroid. 4. Minor aortic atherosclerosis. Electronically Signed   By: Amie Portland M.D.   On: 03/13/2016 20:00   Ct Abdomen Pelvis W Contrast  Result Date: 03/13/2016 CLINICAL DATA:  Pt c/o RUQ pain and SOB for 2 months. Reports has been seen twice for same but was not referred for FU. Has had yellow diarrhea. No vomiting EXAM: CT ANGIOGRAPHY CHEST CT ABDOMEN AND PELVIS WITH CONTRAST TECHNIQUE: Multidetector CT imaging of the chest was performed using the standard protocol during bolus administration of intravenous contrast. Multiplanar CT image reconstructions and MIPs were obtained to evaluate the vascular anatomy. Multidetector CT imaging of the abdomen and pelvis was performed using the standard protocol during bolus administration of intravenous contrast. CONTRAST:  100 mL of Isovue 370 intravenous contrast COMPARISON:  None. FINDINGS: CTA CHEST FINDINGS Cardiovascular: No evidence of a pulmonary embolus. The aorta is normal in caliber. No dissection. No atherosclerotic plaque. Aortic branch vessels are widely patent. Heart is normal in size and configuration. There are minor left coronary artery calcifications. Mediastinum/Nodes: No enlarged mediastinal, hilar, or axillary lymph nodes. Thyroid gland, trachea, and esophagus demonstrate no significant findings. Lungs/Pleura: There are several small nodules, less than 3 mm, 1 in the posterior right upper lobe, another in the posterior left upper lobe and a third in the posterior left lower lobe. Lungs are otherwise clear. No pleural effusion. No pneumothorax. Musculoskeletal: No chest wall abnormality. No acute or significant osseous findings. Review of the MIP images confirms the above findings. CT ABDOMEN and PELVIS FINDINGS  Hepatobiliary: No focal liver abnormality is seen. Status post cholecystectomy. No biliary dilatation. Pancreas: Unremarkable. No pancreatic ductal dilatation or surrounding inflammatory changes. Spleen: Normal in size without focal abnormality. Adrenals/Urinary Tract: Adrenal glands are unremarkable. Kidneys  are normal, without renal calculi, focal lesion, or hydronephrosis. Bladder is unremarkable. Stomach/Bowel: Stomach and small bowel are unremarkable. Colon is unremarkable with no wall thickening or inflammatory change. The appendix is surgically absent. Vascular/Lymphatic: Minimal atherosclerotic calcifications along the abdominal aorta. No aneurysm. No enlarged lymph nodes. Reproductive: Small sub serosal fibroid from the anterior uterine fundus measuring 2 cm. Uterus and adnexa are otherwise unremarkable. Other: No abdominal wall hernia or abnormality. No abdominopelvic ascites. Musculoskeletal: No acute or significant osseous findings. Review of the MIP images confirms the above findings. IMPRESSION: 1. No acute findings within the abdomen pelvis. 2. Status post cholecystectomy and appendectomy. 3. Small sub serosal uterine fibroid. 4. Minor aortic atherosclerosis. Electronically Signed   By: Amie Portlandavid  Ormond M.D.   On: 03/13/2016 20:00    IMPRESSION AND PLAN:  This is a 40 y.o. 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 After lengthy discussion with the patient about her treatment options we agree upon the following plan. Patient was offered admission with the plan to pursue MRI as an inpatient however she prefers to go home and establish outpatient care. Patient will be discharged home in the emergency department she will take 800 mg of iburofen every 8 hours with food.  She is encouraged to utilize alternating heat and cold therapy. She is given 5 mg of Valium oral for this evening. She will pursue  outpatient follow-up in the clinic tomorrow with the plan to obtain an MRI, perception for physical therapy.  Emergency department workup including pending urine studies for acute intermittent porphyria will be followed.  All the records are reviewed and case discussed with ED provider. Management plans discussed with the patient and/or family who express understanding and agree with plan of care.   TOTAL TIME TAKING CARE OF THIS PATIENT: 60 minutes.   Devontae Casasola D.O. on 03/14/2016 at 12:56 AM Between 7am to 6pm - Pager - [phone] After 6pm go to [email or web address] - [email or web address] [email or web address] [facility] [place] [facility] Office [phone] CC: Primary care physician; No PCP Per Patient     Note: This dictation was prepared with Dragon dictation along with smaller phrase technology. Any transcriptional errors that result from this process are unintentional.

## 2016-04-02 NOTE — ED Provider Notes (Signed)
[facility]    CSN: [id number] Arrival date & time: 02/20/16  1839     History   Chief Complaint Chief Complaint  Patient presents with  . Back Pain    HPI Madeline Kelley is a 40 y.o. female.    Back Pain  Location:  Thoracic spine Quality:  Aching Radiates to: right ribs. Pain severity:  Moderate Pain is:  Same all the time Onset quality:  Sudden Duration:  4 weeks Timing:  Constant Progression:  Worsening Chronicity:  New Context comment:  Swimming Relieved by:  OTC medications Worsened by:  Movement, deep breathing and twisting Associated symptoms: no abdominal pain, no abdominal swelling, no bladder incontinence, no bowel incontinence, no dysuria, no fever, no headaches, no leg pain, no numbness, no paresthesias, no pelvic pain, no perianal numbness, no tingling, no weakness and no weight loss     Past Medical History:  Diagnosis Date  . Lupus     Patient Active Problem List   Diagnosis Date Noted  . Abdominal pain 03/13/2016    Past Surgical History:  Procedure Laterality Date  . APPENDECTOMY    . CESAREAN SECTION    . CHOLECYSTECTOMY    . uterine ablation      OB History    No data available       Home Medications    Prior to Admission medications   Medication Sig Start Date End Date Taking? Authorizing Provider  naproxen (NAPROSYN) 500 MG tablet Take 1 tablet (500 mg total) by mouth 2 (two) times daily with a meal. Patient not taking: Reported on 03/13/2016 01/24/16  Yes Jami L Hagler, PA-C  cyclobenzaprine (FLEXERIL) 5 MG tablet Take 1 tablet (5 mg total) by mouth 3 (three) times daily as needed for muscle spasms. 03/13/16   Minna Antis, MD  loratadine (CLARITIN) 10 MG tablet Take 10 mg by mouth every morning.    Historical Provider, MD  Melatonin 10 MG TABS Take 10 mg by mouth at bedtime.    Historical Provider, MD  predniSONE (DELTASONE) 20 MG tablet Take 1 tablet (20 mg total) by mouth daily. Patient not taking: Reported on  03/13/2016 02/20/16   Payton Mccallum, MD  ranitidine (ZANTAC) 150 MG tablet Take 150 mg by mouth daily as needed for heartburn.    Historical Provider, MD    Family History History reviewed. No pertinent family history.  Social History Social History  Substance Use Topics  . Smoking status: Former Games developer  . Smokeless tobacco: Never Used  . Alcohol use Yes     Allergies   Review of patient's allergies indicates no known allergies.   Review of Systems Review of Systems  Constitutional: Negative for fever and weight loss.  Gastrointestinal: Negative for abdominal pain and bowel incontinence.  Genitourinary: Negative for bladder incontinence, dysuria and pelvic pain.  Musculoskeletal: Positive for back pain.  Neurological: Negative for tingling, weakness, numbness, headaches and paresthesias.     Physical Exam Triage Vital Signs ED Triage Vitals  Enc Vitals Group     BP 02/20/16 1903 (!) 147/102     Pulse Rate 02/20/16 1903 93     Resp 02/20/16 1903 16     Temp 02/20/16 1903 97.7 F (36.5 C)     Temp Source 02/20/16 1903 Oral     SpO2 02/20/16 1903 98 %     Weight 02/20/16 1905 257 lb (116.6 kg)     Height 02/20/16 1905 5\' 4"  (1.626 m)     Head Circumference --  Peak Flow --      Pain Score 02/20/16 1911 5     Pain Loc --      Pain Edu? --      Excl. in GC? --    No data found.   Updated Vital Signs BP (!) 147/102 (BP Location: Left Arm)   Pulse 93   Temp 97.7 F (36.5 C) (Oral)   Resp 16   Ht 5\' 4"  (1.626 m)   Wt 257 lb (116.6 kg)   LMP  (LMP Unknown)   SpO2 98%   BMI 44.11 kg/m   Visual Acuity Right Eye Distance:   Left Eye Distance:   Bilateral Distance:    Right Eye Near:   Left Eye Near:    Bilateral Near:     Physical Exam  Constitutional: She appears well-developed and well-nourished. No distress.  Musculoskeletal: She exhibits tenderness. She exhibits no edema.       Thoracic back: She exhibits tenderness and spasm. She exhibits normal  range of motion, no bony tenderness, no swelling, no edema, no deformity, no laceration, no pain and normal pulse.       Lumbar back: Normal. She exhibits normal range of motion, no tenderness, no bony tenderness, no swelling, no edema, no deformity, no laceration, no pain, no spasm and normal pulse.  Neurological: She is alert. She has normal reflexes. She displays normal reflexes. She exhibits normal muscle tone.  Skin: Skin is warm and dry. No rash noted. She is not diaphoretic. No erythema.  Nursing note and vitals reviewed.    UC Treatments / Results  Labs (all labs ordered are listed, but only abnormal results are displayed) Labs Reviewed - No data to display  EKG  EKG Interpretation None       Radiology No results found.  Procedures Procedures (including critical care time)  Medications Ordered in UC Medications - No data to display   Initial Impression / Assessment and Plan / UC Course  I have reviewed the triage vital signs and the nursing notes.  Pertinent labs & imaging results that were available during my care of the patient were reviewed by me and considered in my medical decision making (see chart for details).  Clinical Course      Final Clinical Impressions(s) / UC Diagnoses   Final diagnoses:  Right-sided thoracic back pain    New Prescriptions Discharge Medication List as of 02/20/2016  8:39 PM    START taking these medications   Details  predniSONE (DELTASONE) 20 MG tablet Take 1 tablet (20 mg total) by mouth daily., Starting Tue 02/20/2016, Print    HYDROcodone-acetaminophen (NORCO/VICODIN) 5-325 MG tablet 1 tab po qhs prn, Print       1.diagnosis reviewed with patient 2. rx as per orders above; reviewed possible side effects, interactions, risks and benefits  3. Recommend supportive treatment with heat, stretching 4. Follow-up prn if symptoms worsen or don't improve   Payton Mccallumrlando Jeury Mcnab, MD 04/02/16 1801

## 2017-08-17 IMAGING — CT CT ANGIO CHEST
4 of 11 series · 17 of 46 positions shown · IV contrast (APPLIED)
Comparison: None.

CLINICAL DATA: Pt c/o RUQ pain and SOB for 2 months. Reports has
been seen twice for same but was not referred for FU. Has had yellow
diarrhea. No vomiting

EXAM:
CT ANGIOGRAPHY CHEST
CT ABDOMEN AND PELVIS WITH CONTRAST
TECHNIQUE: Multidetector CT imaging of the chest was performed using the
standard protocol during bolus administration of intravenous
contrast. Multiplanar CT image reconstructions and MIPs were
obtained to evaluate the vascular anatomy. Multidetector CT imaging
of the abdomen and pelvis was performed using the standard protocol
during bolus administration of intravenous contrast.
CONTRAST:  100 mL of Isovue 370 intravenous contrast

[Series 4: axial st · axial · 0.96mm/px · z∈[-544,-330]mm · 5 of 107 slices shown (1 of 2)]
[im 18/107  lung]
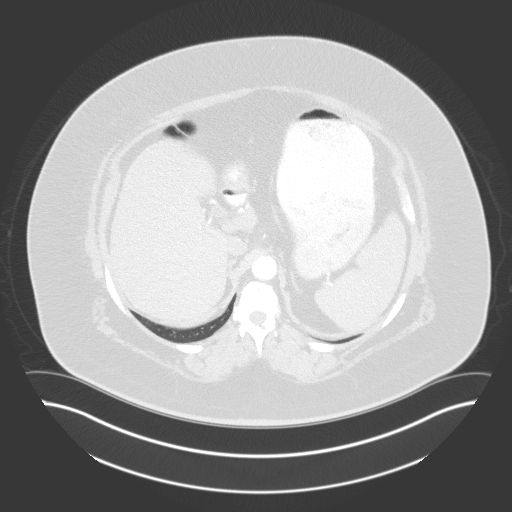
[im 36/107  lung]
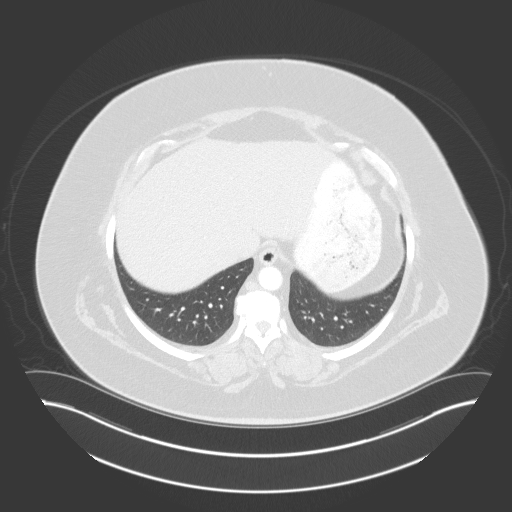
[im 54/107  lung]
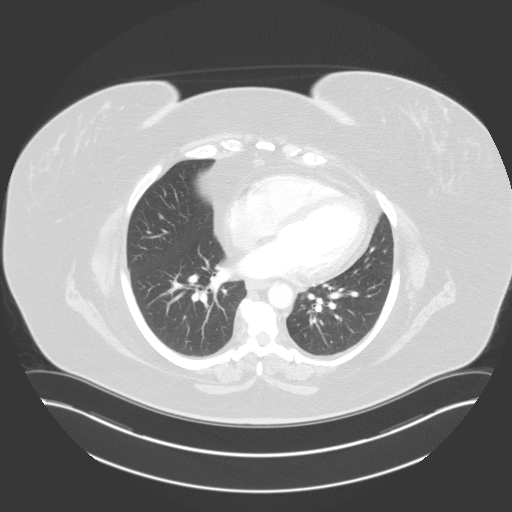
[im 71/107  lung]
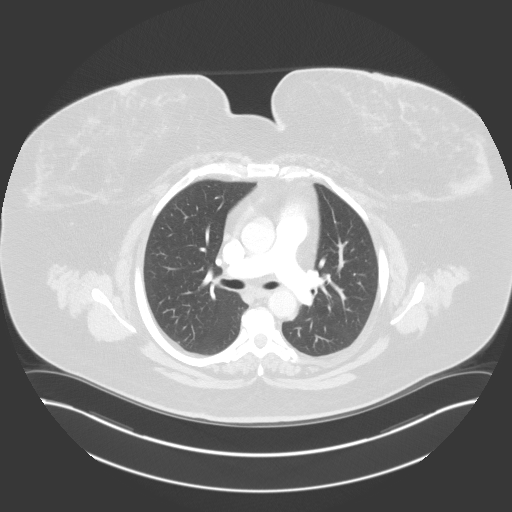
[im 89/107  lung]
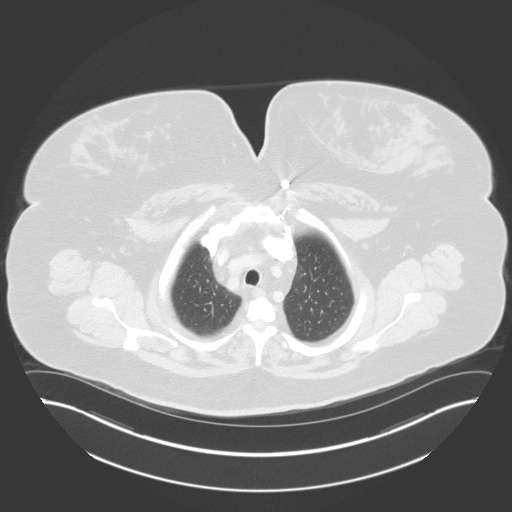

[Series 5: thins · axial · 0.96mm/px · z∈[-576,-296]mm · 8 of 319 slices shown]
[im 20/319  lung]
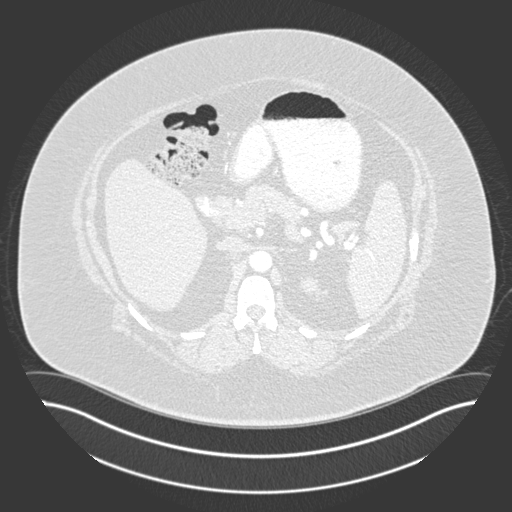
[im 60/319  lung]
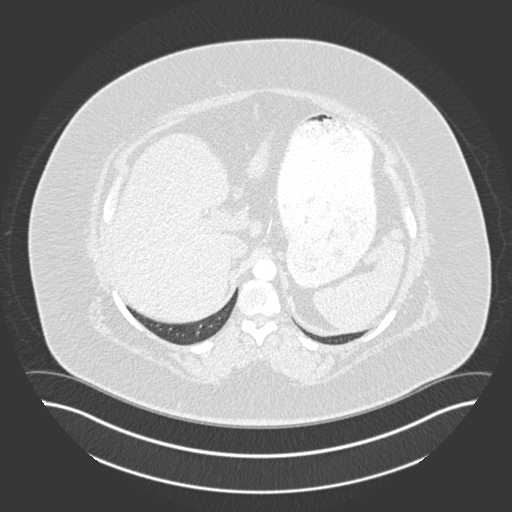
[im 100/319  lung]
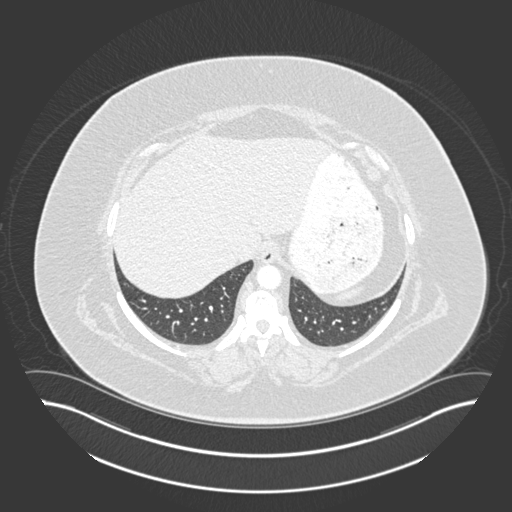
[im 140/319  lung]
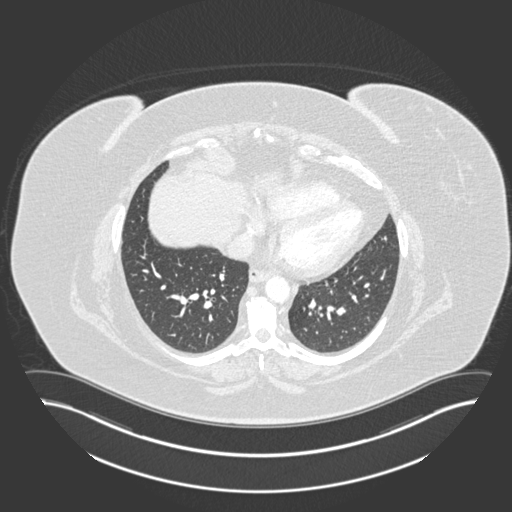
[im 179/319  lung]
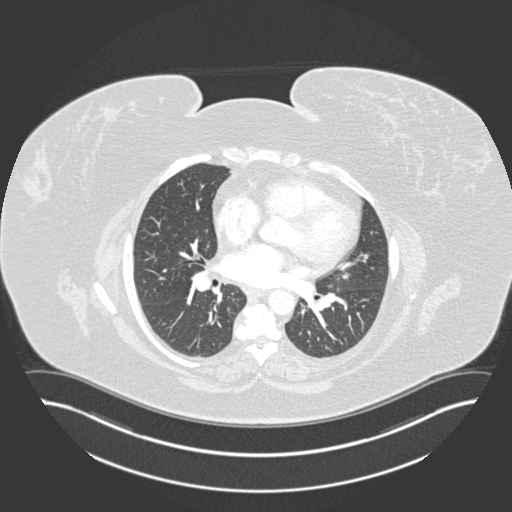
[im 219/319  lung]
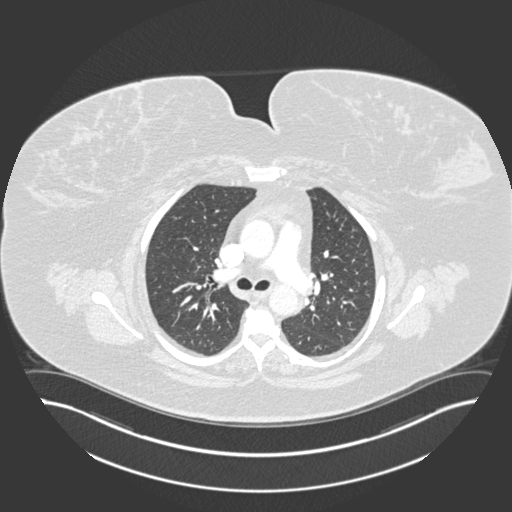
[im 259/319  lung]
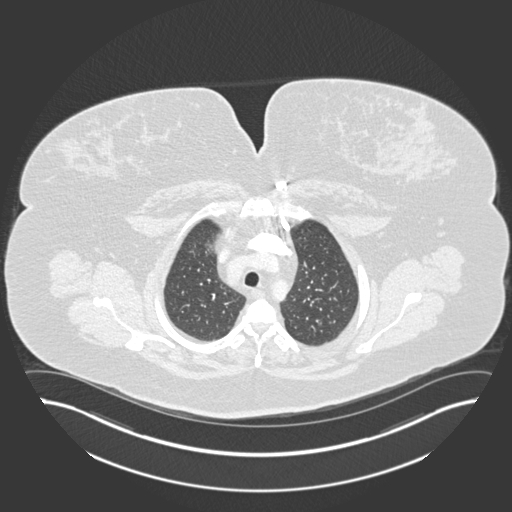
[im 299/319  lung]
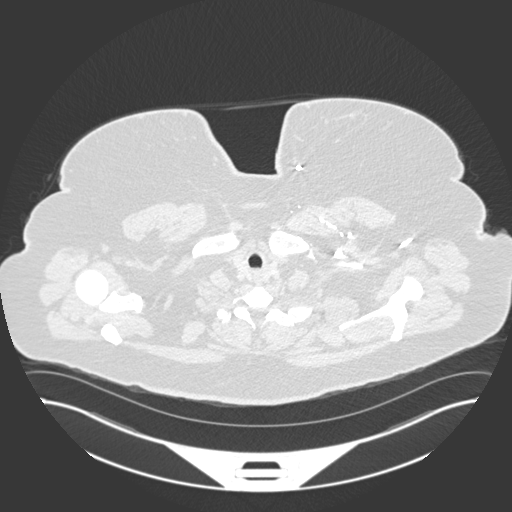

[Series 7: coronal mpr · coronal · 0.66mm/px · 1 of 170 slices shown]
[im 85/170  soft-tissue]
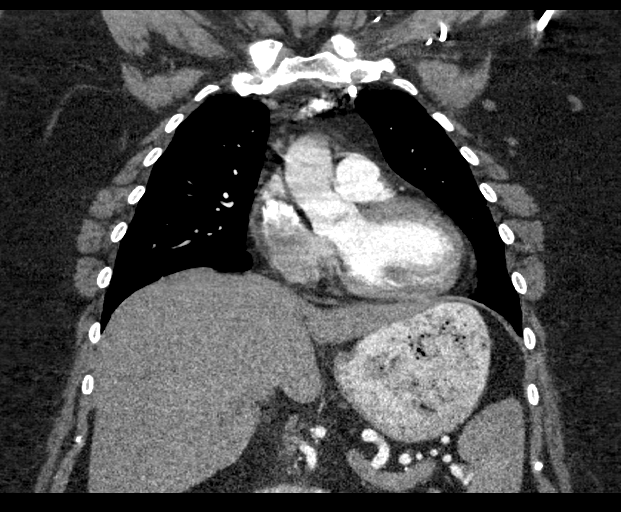

[Series 11: axial st · axial · 0.98mm/px · z∈[-798,-553]mm · 3 of 99 slices shown (2 of 2)]
[im 25/99  lung]
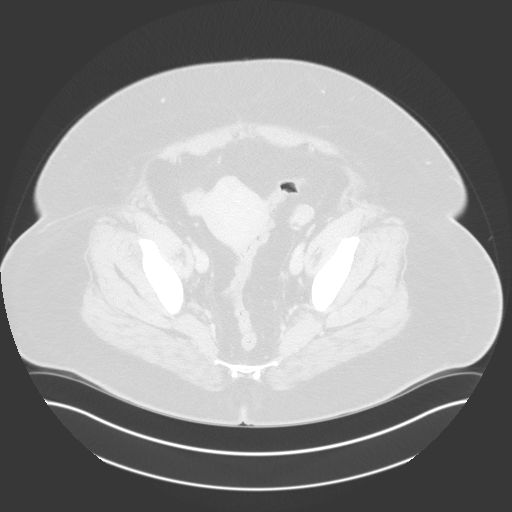
[im 50/99  soft-tissue]
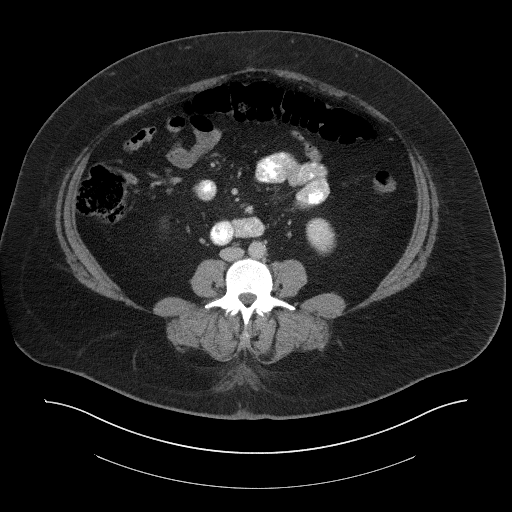
[im 74/99  lung]
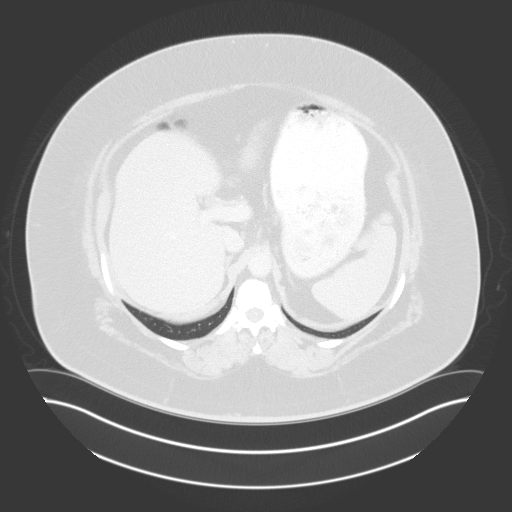

[17 of 46 positions shown; findings below may reference images not displayed]

FINDINGS: CTA CHEST FINDINGS

Cardiovascular: No evidence of a pulmonary embolus. The aorta is
normal in caliber. No dissection. No atherosclerotic plaque. Aortic
branch vessels are widely patent. Heart is normal in size and
configuration. There are minor left coronary artery calcifications.

Mediastinum/Nodes: No enlarged mediastinal, hilar, or axillary lymph
nodes. Thyroid gland, trachea, and esophagus demonstrate no
significant findings.

Lungs/Pleura: There are several small nodules, less than 3 mm, 1 in
the posterior right upper lobe, another in the posterior left upper
lobe and a third in the posterior left lower lobe. Lungs are
otherwise clear. No pleural effusion. No pneumothorax.

Musculoskeletal: No chest wall abnormality. No acute or significant
osseous findings.

Review of the MIP images confirms the above findings.

CT ABDOMEN and PELVIS FINDINGS

Hepatobiliary: No focal liver abnormality is seen. Status post
cholecystectomy. No biliary dilatation.

Pancreas: Unremarkable. No pancreatic ductal dilatation or
surrounding inflammatory changes.

Spleen: Normal in size without focal abnormality.

Adrenals/Urinary Tract: Adrenal glands are unremarkable. Kidneys are
normal, without renal calculi, focal lesion, or hydronephrosis.
Bladder is unremarkable.

Stomach/Bowel: Stomach and small bowel are unremarkable. Colon is
unremarkable with no wall thickening or inflammatory change. The
appendix is surgically absent.

Vascular/Lymphatic: Minimal atherosclerotic calcifications along the
abdominal aorta. No aneurysm. No enlarged lymph nodes.

Reproductive: Small sub serosal fibroid from the anterior uterine
fundus measuring 2 cm. Uterus and adnexa are otherwise unremarkable.

Other: No abdominal wall hernia or abnormality. No abdominopelvic
ascites.

Musculoskeletal: No acute or significant osseous findings.

Review of the MIP images confirms the above findings.
IMPRESSION: 1. No acute findings within the abdomen pelvis.
2. Status post cholecystectomy and appendectomy.
3. Small sub serosal uterine fibroid.
4. Minor aortic atherosclerosis.
